# Patient Record
Sex: Female | Born: 1975 | Race: White | Marital: Married | Smoking: Current every day smoker
Health system: Southern US, Community
[De-identification: ages and names within clinical notes are randomized; demographics above are authoritative.]

---

## 2021-02-07 ENCOUNTER — Other Ambulatory Visit: Payer: Self-pay | Admitting: Physician Assistant

## 2021-02-07 DIAGNOSIS — R42 Dizziness and giddiness: Secondary | ICD-10-CM

## 2021-02-07 DIAGNOSIS — H53483 Generalized contraction of visual field, bilateral: Secondary | ICD-10-CM

## 2021-02-07 DIAGNOSIS — R519 Headache, unspecified: Secondary | ICD-10-CM

## 2021-02-13 ENCOUNTER — Ambulatory Visit
Admission: RE | Admit: 2021-02-13 | Discharge: 2021-02-13 | Disposition: A | Discharge status: Home / Self Care | Payer: Federal, State, Local not specified - PPO | Source: Ambulatory Visit | Attending: Physician Assistant | Admitting: Physician Assistant

## 2021-02-13 ENCOUNTER — Other Ambulatory Visit: Payer: Self-pay

## 2021-02-13 DIAGNOSIS — R42 Dizziness and giddiness: Secondary | ICD-10-CM | POA: Insufficient documentation

## 2021-02-13 DIAGNOSIS — R519 Headache, unspecified: Secondary | ICD-10-CM | POA: Diagnosis present

## 2021-02-13 DIAGNOSIS — H53483 Generalized contraction of visual field, bilateral: Secondary | ICD-10-CM | POA: Diagnosis not present

## 2021-02-13 IMAGING — MR MR HEAD W/O CM
12 series · 40 of 48 positions shown · IV contrast (agent unspecified)
Comparison: Head CT [DATE].

CLINICAL DATA: Tunnel visual field constriction of both eyes
[S2] ([S2]-CM). Headache disorder [S2] ([S2]-CM). Vertigo
R42 ([S2]-CM). Additional history provided by scanning
technologist: Patient reports vertigo, dizziness, vomiting,
headache, history of migraines, history of stroke in [S2].

EXAM:
MRI HEAD WITHOUT CONTRAST
MRV HEAD WITHOUT AND WITH CONTRAST
TECHNIQUE: Multiplanar, multiecho pulse sequences of the brain and surrounding
structures were obtained without intravenous contrast. Angiographic
images of the intracranial venous structures were obtained using MRV
technique without and with intravenous contrast.

[Series 5: ax dwi_tracew · axial · 3.0mm · 0.65mm/px · z∈[-92,+62]mm · 3 of 48 slices shown]
[im 1/48]
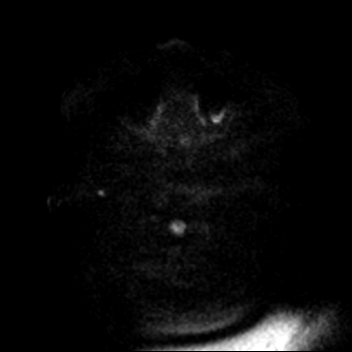
[im 24/48]
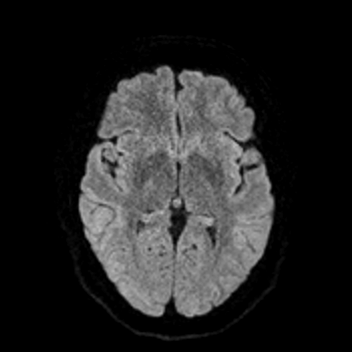
[im 48/48]
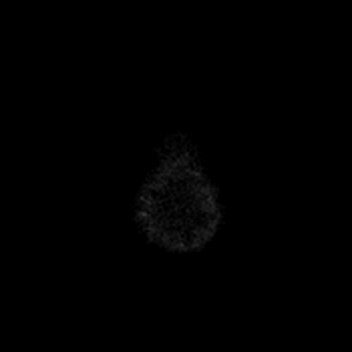

[Series 6: ax dwi_adc · axial · 3.0mm · 0.65mm/px · z∈[-92,+62]mm · 4 of 48 slices shown]
[im 1/48]
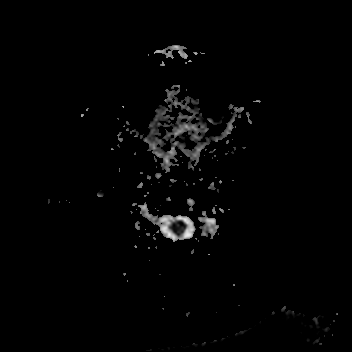
[im 16/48]
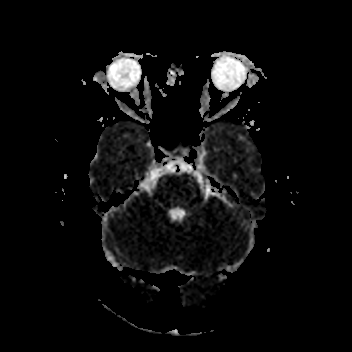
[im 32/48]
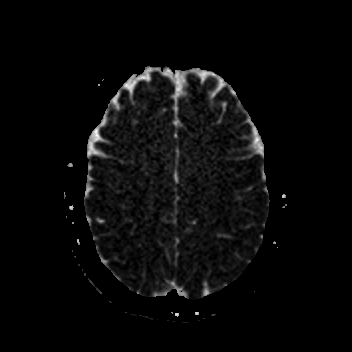
[im 48/48]
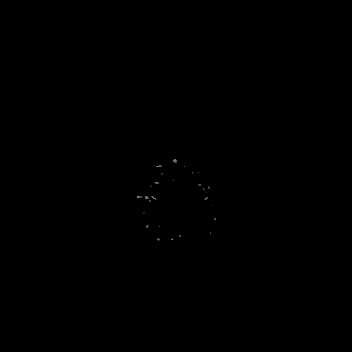

[Series 7: cor dwi_tracew · coronal · 5.0mm · 0.65mm/px · 3 of 38 slices shown]
[im 1/38]
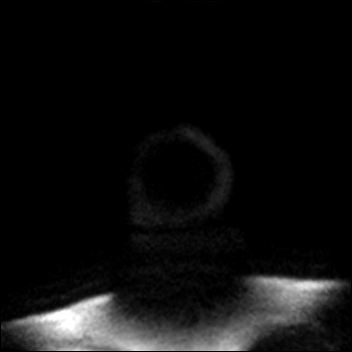
[im 19/38]
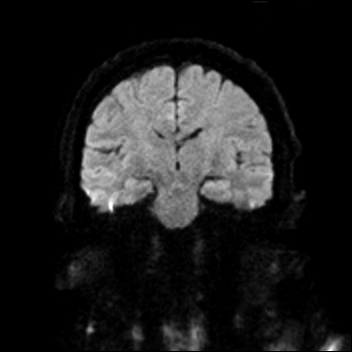
[im 38/38]
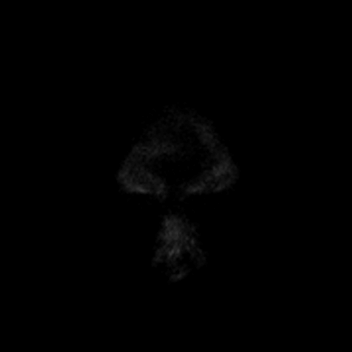

[Series 8: cor dwi_adc · coronal · 5.0mm · 0.65mm/px · 3 of 36 slices shown]
[im 1/36]
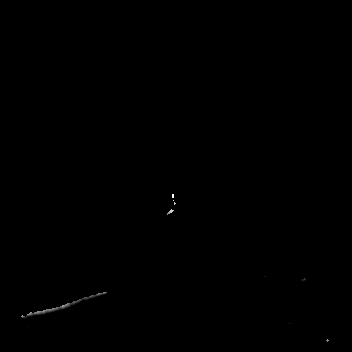
[im 18/36]
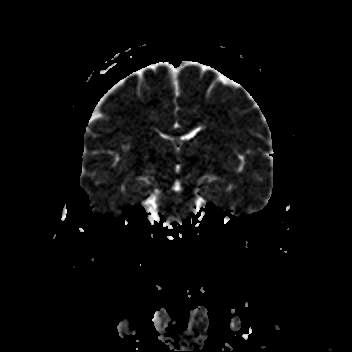
[im 36/36]
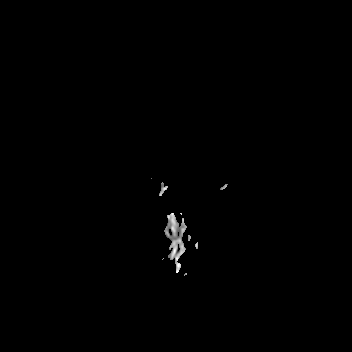

[Series 9: T1 · sagittal · 5.0mm · 0.62mm/px · 2 of 25 slices shown (1 of 3)]
[im 1/25]
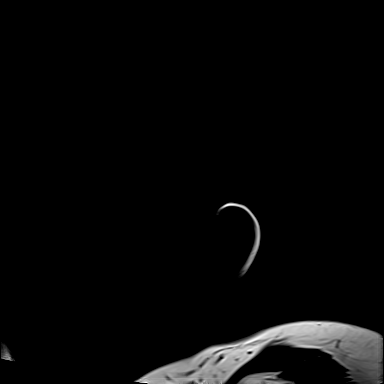
[im 25/25]
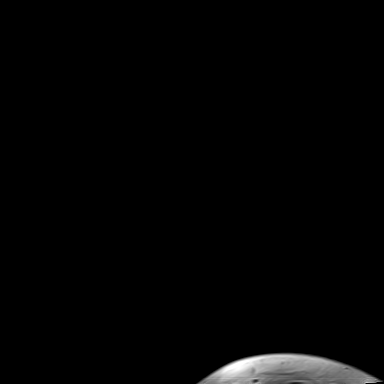

[Series 10: T2 · axial · 5.0mm · 0.53mm/px · z∈[-93,+62]mm · 2 of 27 slices shown (1 of 2)]
[im 1/27]
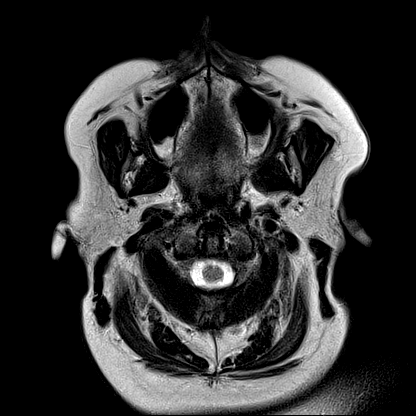
[im 27/27]
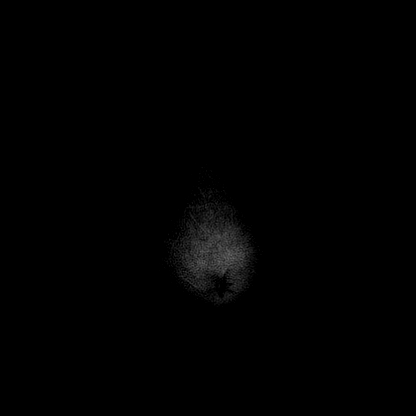

[Series 12: pha_images · axial · 3.0mm · 0.90mm/px · z∈[-104,+72]mm · 4 of 59 slices shown]
[im 1/59]
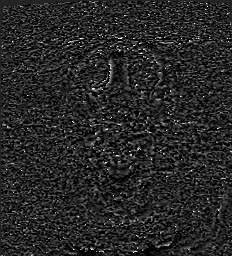
[im 20/59]
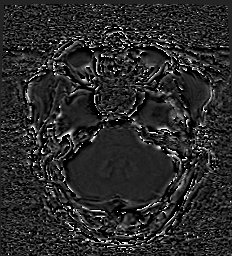
[im 39/59]
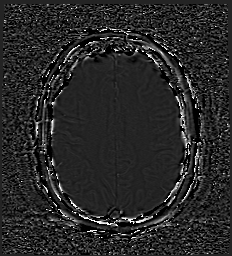
[im 59/59]
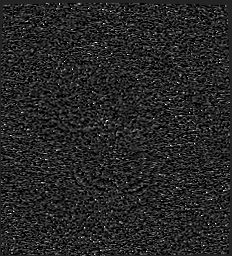

[Series 13: swi_images · axial · 3.0mm · 0.90mm/px · z∈[-104,-62]mm · 2 of 60 slices shown]
[im 1/60]
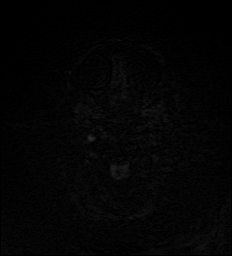
[im 15/60]
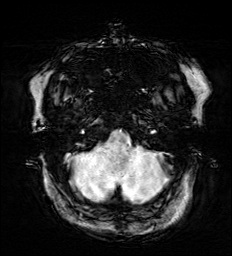

[Series 15: FLAIR · axial · 3.0mm · 0.53mm/px · z∈[-96,+65]mm · 4 of 55 slices shown]
[im 1/55]
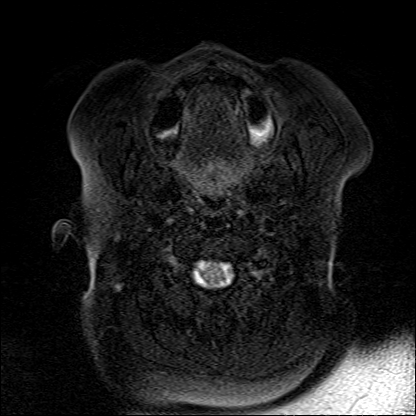
[im 19/55]
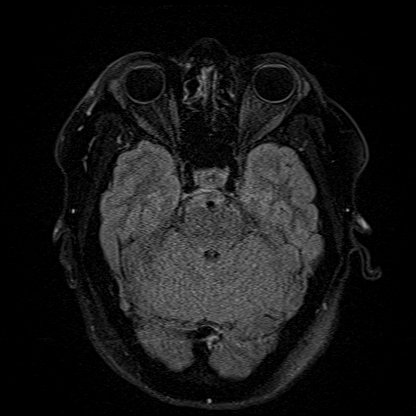
[im 37/55]
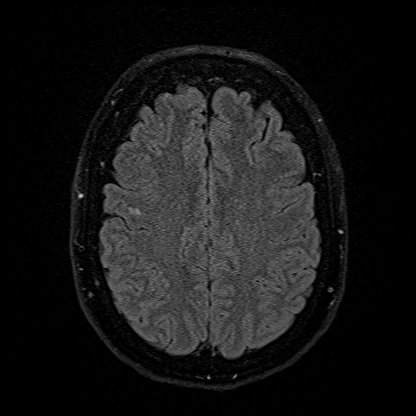
[im 55/55]
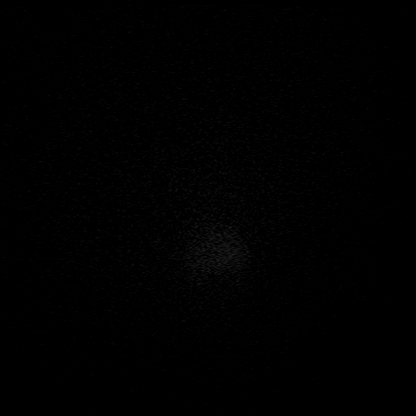

[Series 16: T1 · axial · 1.0mm · 0.98mm/px · z∈[-101,+73]mm · 8 of 176 slices shown (2 of 3)]
[im 1/176]
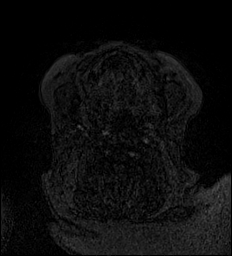
[im 30/176]
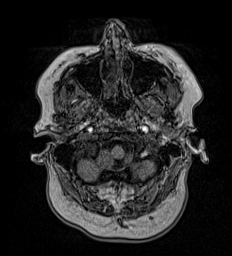
[im 59/176]
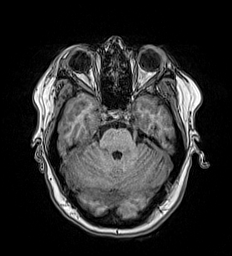
[im 73/176]
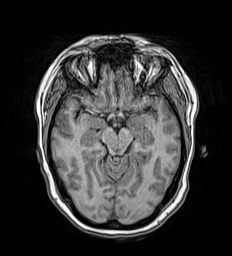
[im 103/176]
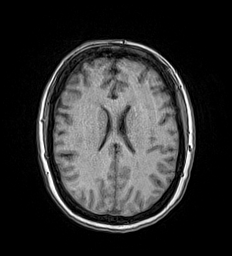
[im 117/176]
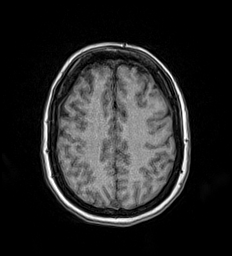
[im 146/176]
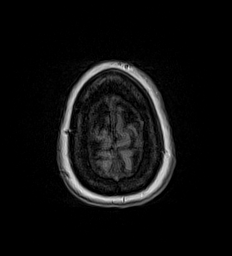
[im 176/176]
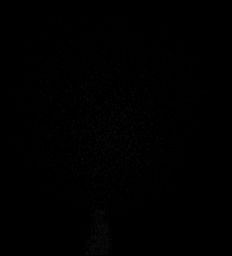

[Series 17: T2 · coronal · 5.0mm · 0.45mm/px · 3 of 33 slices shown (2 of 2)]
[im 1/33]
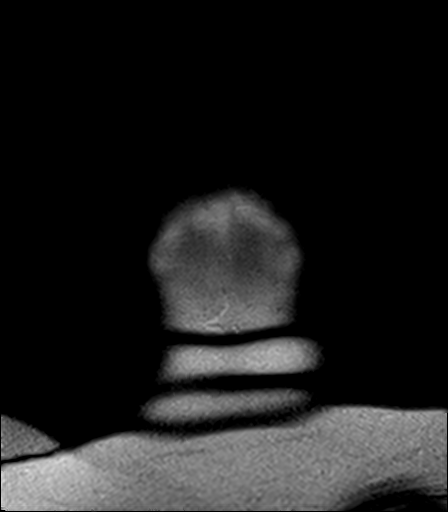
[im 17/33]
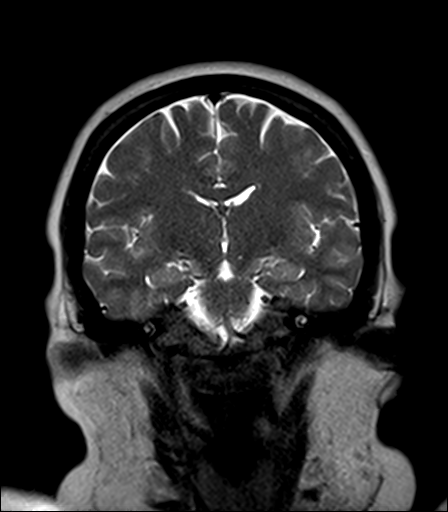
[im 33/33]
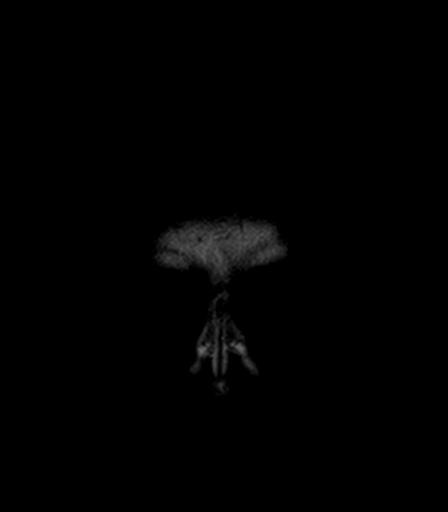

[Series 18: T1 · axial · 5.0mm · 0.90mm/px · z∈[-94,+61]mm · 2 of 27 slices shown (3 of 3)]
[im 1/27]
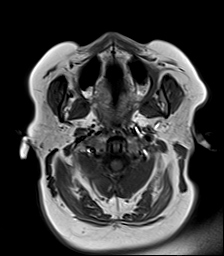
[im 27/27]
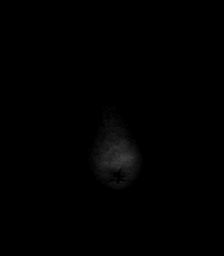

[40 of 48 positions shown; findings below may reference images not displayed]

Head CT and CT angiogram head/neck
[DATE]. Brain MRI [DATE].

CONTRAST:  9mL GADAVIST GADOBUTROL 1 MMOL/ML IV SOLN
FINDINGS: MR HEAD FINDINGS

Brain:

Mild intermittent motion degradation.

Cerebral volume is normal.

Mild multifocal T2/FLAIR hyperintensity within the cerebral white
matter, nonspecific and not significantly changed from the brain MRI
of [DATE].

There is no acute infarct.

No evidence of an intracranial mass.

No chronic intracranial blood products.

No extra-axial fluid collection.

No midline shift.

Redemonstrated partially empty sella turcica.

Vascular: Expected proximal arterial flow voids.

Skull and upper cervical spine: No focal marrow lesion.

Sinuses/Orbits: Trace bilateral ethmoid sinus mucosal thickening.

MRV HEAD FINDINGS

The superior sagittal sinus, internal cerebral veins, vein of KALBERMATTER,
straight sinus, transverse sinuses, sigmoid sinuses and visualized
jugular veins are patent. There is no appreciable intracranial
venous thrombosis. There are prominent arachnoid granulations within
the bilateral transverse/sigmoid dural venous sinuses, but no
convincing dural venous sinus stenosis.
IMPRESSION: MRI brain:

1. No evidence of acute intracranial abnormality.
2. Stable MRI appearance of the brain as compared to [DATE].
3. Redemonstrated partially empty sella turcica. This finding is
commonly incidental, but can be associated with idiopathic
intracranial hypertension.
4. Mild multifocal T2/FLAIR hyperintensity within the cerebral white
matter. Findings are nonspecific and differential considerations
include chronic small vessel ischemic disease, signal changes
associated with chronic migraine headaches, sequela of a prior
infectious/inflammatory process or sequela of a demyelinating
process such as multiple sclerosis, among others.

MRV head:

1. No evidence of intracranial venous thrombosis.
2. No convincing dural venous sinus stenosis.

## 2021-02-13 IMAGING — MR MR MRV HEAD WO/W CM
4 of 5 series · 30 of 48 positions shown · IV contrast (9ml Gadavist)
Comparison: Head CT [DATE].

CLINICAL DATA: Tunnel visual field constriction of both eyes
[S2] ([S2]-CM). Headache disorder [S2] ([S2]-CM). Vertigo
R42 ([S2]-CM). Additional history provided by scanning
technologist: Patient reports vertigo, dizziness, vomiting,
headache, history of migraines, history of stroke in [S2].

EXAM:
MRI HEAD WITHOUT CONTRAST
MRV HEAD WITHOUT AND WITH CONTRAST
TECHNIQUE: Multiplanar, multiecho pulse sequences of the brain and surrounding
structures were obtained without intravenous contrast. Angiographic
images of the intracranial venous structures were obtained using MRV
technique without and with intravenous contrast.

[Series 5: TOF · coronal · 2.5mm · 0.98mm/px · 9 of 127 slices shown]
[im 1/127]
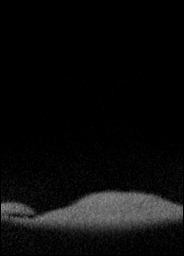
[im 16/127]
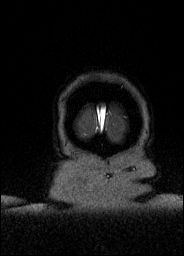
[im 32/127]
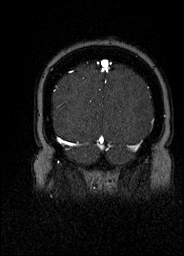
[im 48/127]
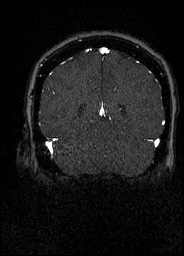
[im 64/127]
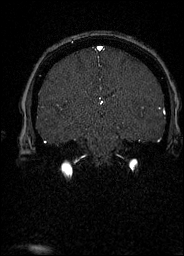
[im 79/127]
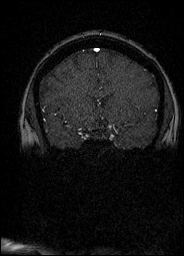
[im 95/127]
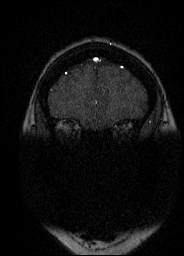
[im 111/127]
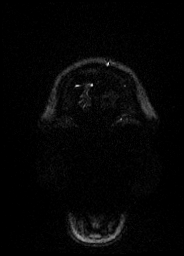
[im 127/127]
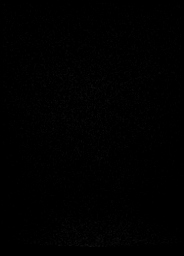

[Series 12: T1 post-contrast · sagittal · 1.0mm · 0.98mm/px · 8 of 192 slices shown]
[im 1/192]
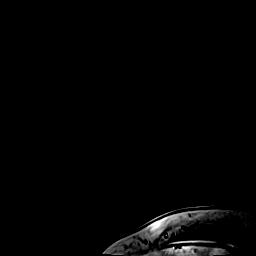
[im 30/192]
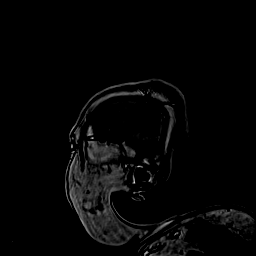
[im 59/192]
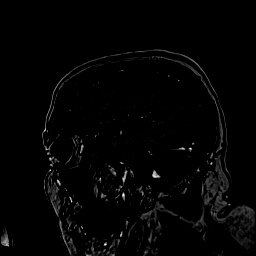
[im 89/192]
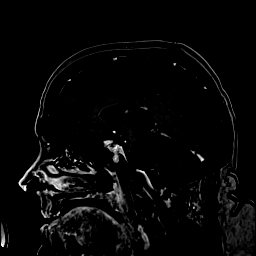
[im 103/192]
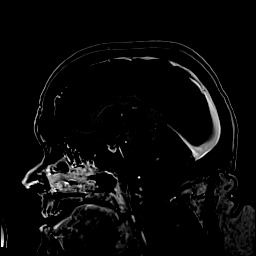
[im 133/192]
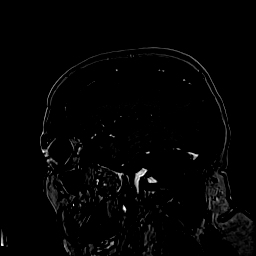
[im 162/192]
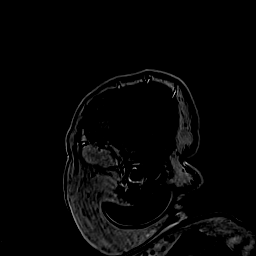
[im 192/192]
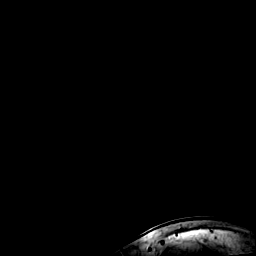

[Series 1069: mpr 1mm range · coronal · 1.0mm · 0.49mm/px · 9 of 175 slices shown]
[im 1/175]
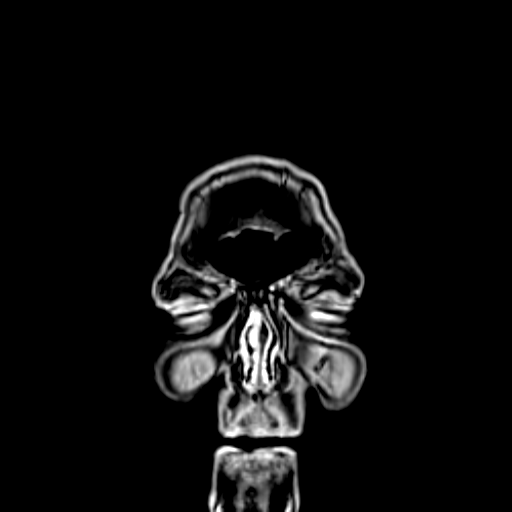
[im 32/175]
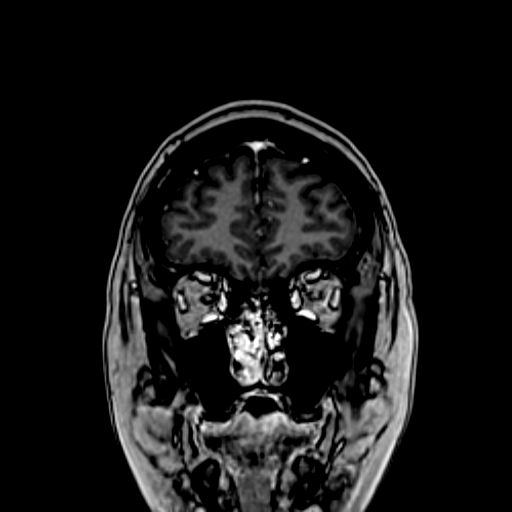
[im 48/175]
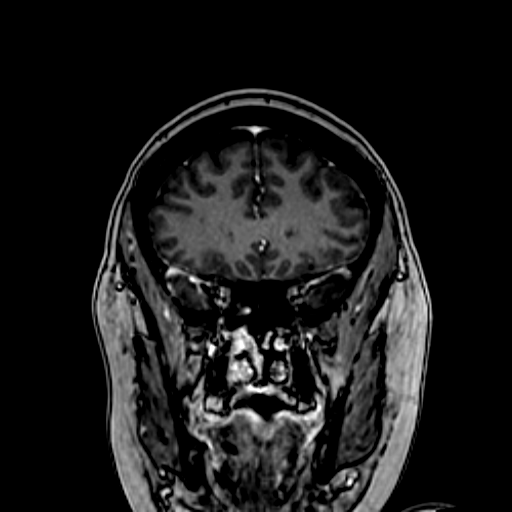
[im 80/175]
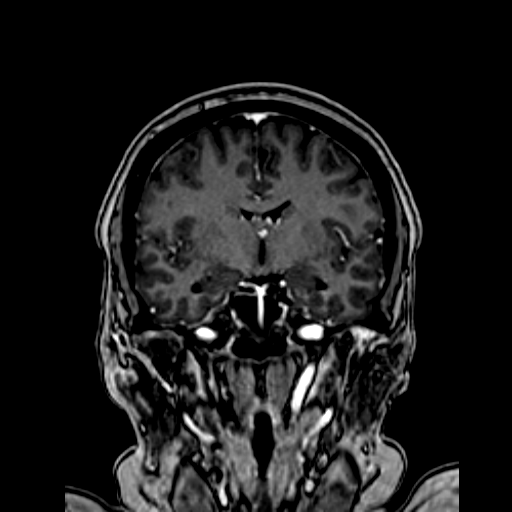
[im 95/175]
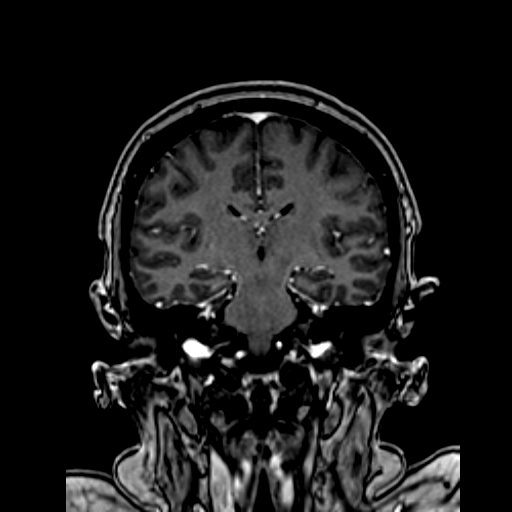
[im 127/175]
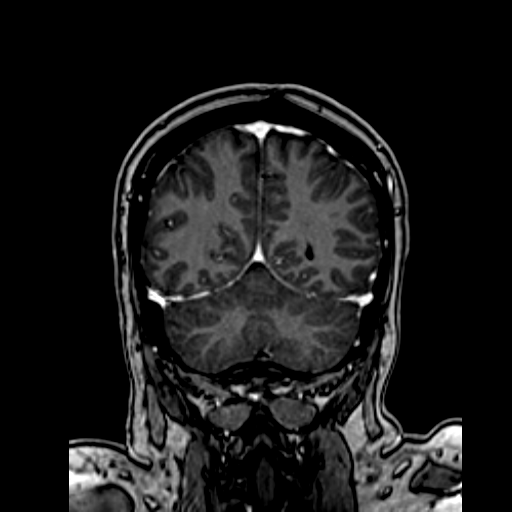
[im 143/175]
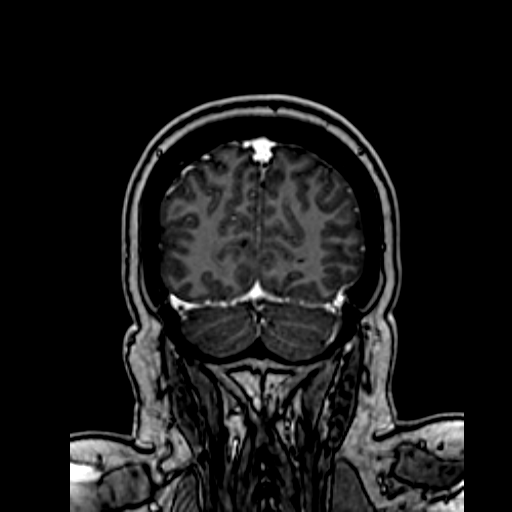
[im 159/175]
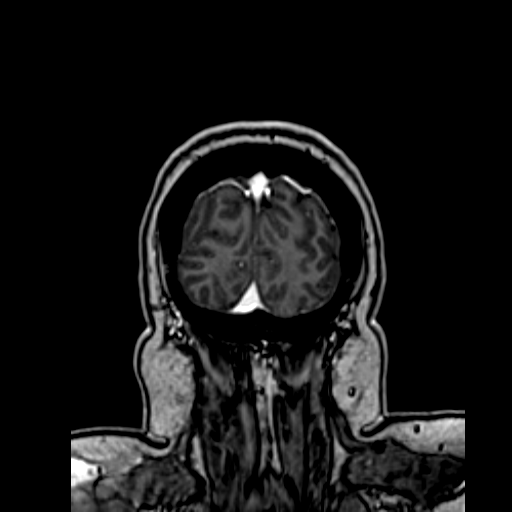
[im 175/175]
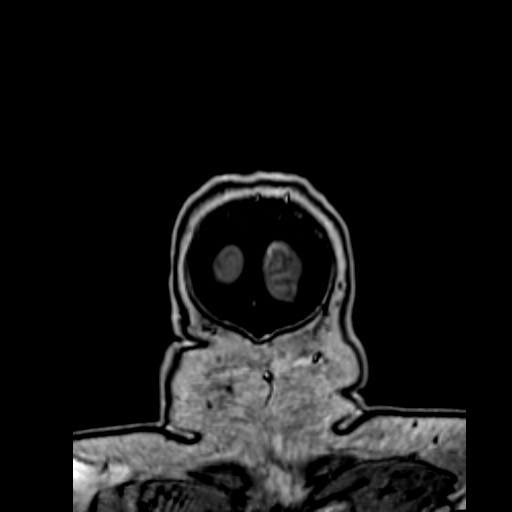

[Series 1075: mpr 1mm range(2) · axial · 1.0mm · 0.49mm/px · z∈[-120,+34]mm · 4 of 175 slices shown]
[im 1/175]
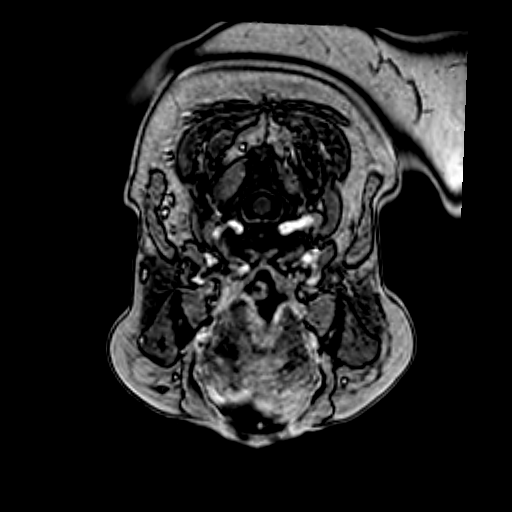
[im 32/175]
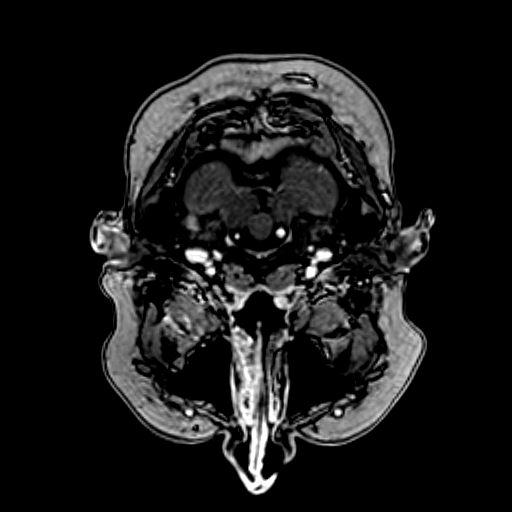
[im 95/175]
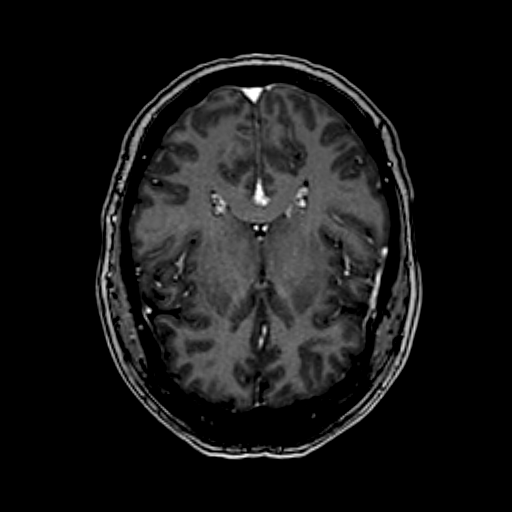
[im 159/175]
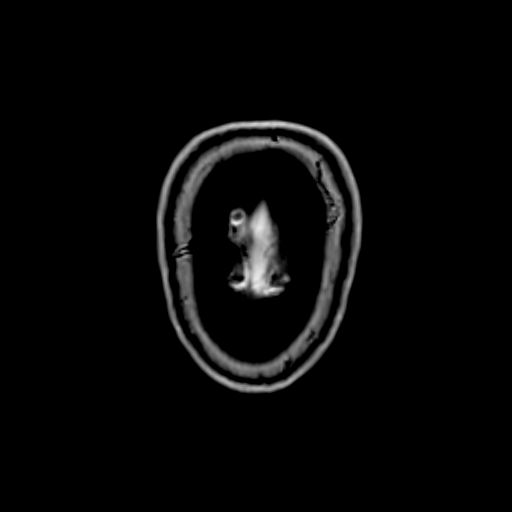

[30 of 48 positions shown; findings below may reference images not displayed]

Head CT and CT angiogram head/neck
[DATE]. Brain MRI [DATE].

CONTRAST:  9mL GADAVIST GADOBUTROL 1 MMOL/ML IV SOLN
FINDINGS: MR HEAD FINDINGS

Brain:

Mild intermittent motion degradation.

Cerebral volume is normal.

Mild multifocal T2/FLAIR hyperintensity within the cerebral white
matter, nonspecific and not significantly changed from the brain MRI
of [DATE].

There is no acute infarct.

No evidence of an intracranial mass.

No chronic intracranial blood products.

No extra-axial fluid collection.

No midline shift.

Redemonstrated partially empty sella turcica.

Vascular: Expected proximal arterial flow voids.

Skull and upper cervical spine: No focal marrow lesion.

Sinuses/Orbits: Trace bilateral ethmoid sinus mucosal thickening.

MRV HEAD FINDINGS

The superior sagittal sinus, internal cerebral veins, vein of KALBERMATTER,
straight sinus, transverse sinuses, sigmoid sinuses and visualized
jugular veins are patent. There is no appreciable intracranial
venous thrombosis. There are prominent arachnoid granulations within
the bilateral transverse/sigmoid dural venous sinuses, but no
convincing dural venous sinus stenosis.
IMPRESSION: MRI brain:

1. No evidence of acute intracranial abnormality.
2. Stable MRI appearance of the brain as compared to [DATE].
3. Redemonstrated partially empty sella turcica. This finding is
commonly incidental, but can be associated with idiopathic
intracranial hypertension.
4. Mild multifocal T2/FLAIR hyperintensity within the cerebral white
matter. Findings are nonspecific and differential considerations
include chronic small vessel ischemic disease, signal changes
associated with chronic migraine headaches, sequela of a prior
infectious/inflammatory process or sequela of a demyelinating
process such as multiple sclerosis, among others.

MRV head:

1. No evidence of intracranial venous thrombosis.
2. No convincing dural venous sinus stenosis.

## 2021-02-13 MED ORDER — GADOBUTROL 1 MMOL/ML IV SOLN: 9.0000 mL | Freq: Once | INTRAVENOUS | Status: DC | PRN

## 2021-02-13 MED ORDER — GADOBUTROL 1 MMOL/ML IV SOLN
9.0000 mL | Freq: Once | INTRAVENOUS | Status: AC | PRN
  Administered 2021-02-13: 9 mL via INTRAVENOUS

## 2021-03-05 ENCOUNTER — Other Ambulatory Visit: Payer: Self-pay | Admitting: Physician Assistant

## 2021-03-05 DIAGNOSIS — H53483 Generalized contraction of visual field, bilateral: Secondary | ICD-10-CM

## 2021-03-05 DIAGNOSIS — R200 Anesthesia of skin: Secondary | ICD-10-CM

## 2021-03-05 DIAGNOSIS — G932 Benign intracranial hypertension: Secondary | ICD-10-CM

## 2021-03-05 DIAGNOSIS — H43393 Other vitreous opacities, bilateral: Secondary | ICD-10-CM

## 2021-03-05 DIAGNOSIS — R519 Headache, unspecified: Secondary | ICD-10-CM

## 2021-03-05 DIAGNOSIS — R42 Dizziness and giddiness: Secondary | ICD-10-CM

## 2021-03-21 ENCOUNTER — Ambulatory Visit
Admission: RE | Admit: 2021-03-21 | Discharge: 2021-03-21 | Disposition: A | Discharge status: Home / Self Care | Payer: Federal, State, Local not specified - PPO | Source: Ambulatory Visit | Attending: Physician Assistant | Admitting: Physician Assistant

## 2021-03-21 ENCOUNTER — Other Ambulatory Visit: Payer: Self-pay

## 2021-03-21 DIAGNOSIS — R42 Dizziness and giddiness: Secondary | ICD-10-CM

## 2021-03-21 DIAGNOSIS — G932 Benign intracranial hypertension: Secondary | ICD-10-CM

## 2021-03-21 DIAGNOSIS — R200 Anesthesia of skin: Secondary | ICD-10-CM

## 2021-03-21 DIAGNOSIS — G35 Multiple sclerosis: Secondary | ICD-10-CM | POA: Insufficient documentation

## 2021-03-21 DIAGNOSIS — R519 Headache, unspecified: Secondary | ICD-10-CM | POA: Diagnosis present

## 2021-03-21 DIAGNOSIS — H53483 Generalized contraction of visual field, bilateral: Secondary | ICD-10-CM

## 2021-03-21 DIAGNOSIS — H43393 Other vitreous opacities, bilateral: Secondary | ICD-10-CM

## 2021-03-21 LAB — PATHOLOGIST SMEAR REVIEW

## 2021-03-21 LAB — ALBUMIN: Albumin: 3.8 g/dL (ref 3.5–5.0)

## 2021-03-21 LAB — CSF CELL COUNT WITH DIFFERENTIAL
Eosinophils, CSF: 0 %
Lymphs, CSF: 75 %
Monocyte-Macrophage-Spinal Fluid: 25 %
RBC Count, CSF: 10 /mm3 — ABNORMAL HIGH (ref 0–3)
Segmented Neutrophils-CSF: 0 %
Tube #: 3
WBC, CSF: 12 /mm3 (ref 0–5)

## 2021-03-21 LAB — GLUCOSE, CSF: Glucose, CSF: 65 mg/dL (ref 40–70)

## 2021-03-21 LAB — PROTEIN, CSF: Total  Protein, CSF: 33 mg/dL (ref 15–45)

## 2021-03-21 IMAGING — XA DG FLUORO GUIDE SPINAL/SI JT INJ*R*
1 series · 1 of 1 positions shown · non-contrast
Comparison: none

CLINICAL DATA: Headache for months, visual floaters, bilateral
numbness

[Series 2: vasc extremity · 1 of 1 slices shown]
[im 1/1]
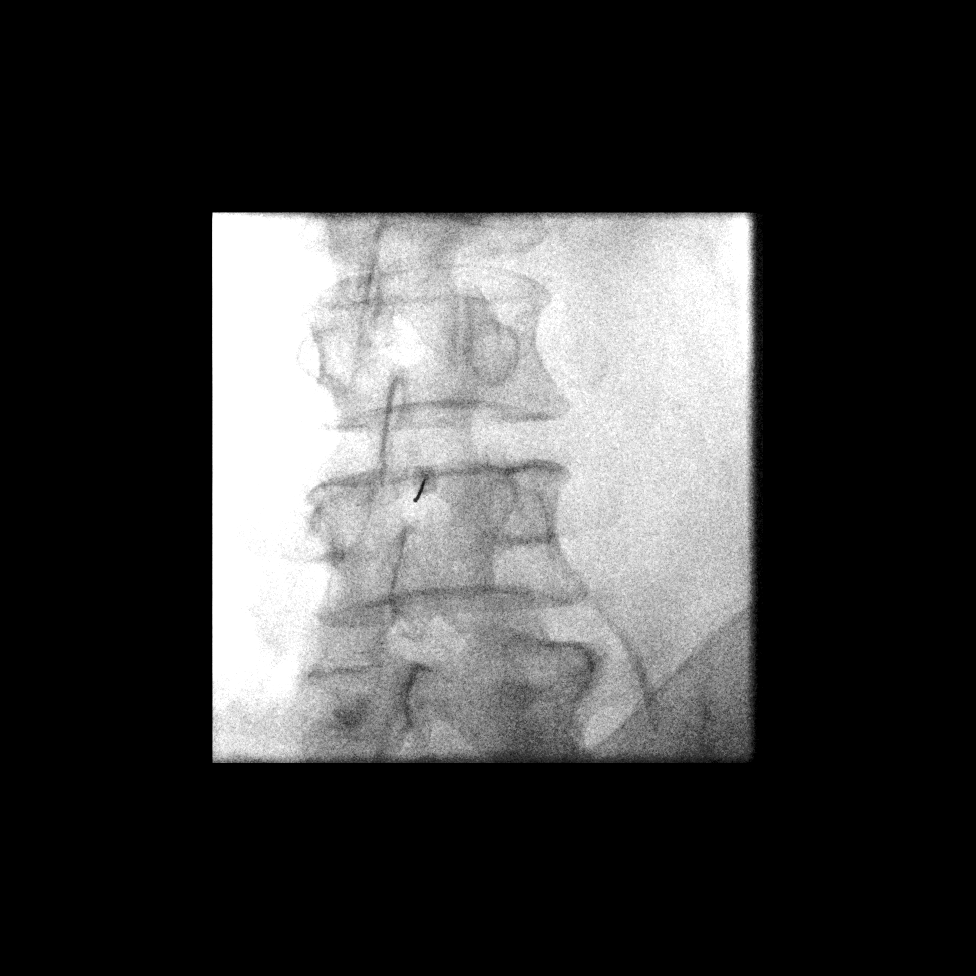

[1 of 1 positions shown; findings below may reference images not displayed]

EXAM:
DIAGNOSTIC LUMBAR PUNCTURE UNDER FLUOROSCOPIC GUIDANCE

FLUOROSCOPY TIME:  Fluoroscopy Time:  0.6 minute

Radiation Exposure Index (if provided by the fluoroscopic device):
5.6 mGy

PROCEDURE:
Informed consent was obtained from the patient prior to the
procedure, including potential complications of headache, allergy,
and pain. With the patient prone, the lower back was prepped with
Betadine. 1% Lidocaine was used for local anesthesia. Lumbar
puncture was performed at the initially L4-5, but patient had
radicular symptoms. Lumbar puncture was reperformed at L3-4 level
using a 22 gauge needle with return of clear CSF with an opening
pressure of 15 cm water. 8 ml of CSF were obtained for laboratory
studies. The patient tolerated the procedure well and there were no
apparent complications.
IMPRESSION: Successful fluoroscopic guided lumbar puncture as described above.

## 2021-03-21 MED ORDER — LIDOCAINE HCL (PF) 1 % IJ SOLN
5.0000 mL | Freq: Once | INTRAMUSCULAR | Status: AC
  Administered 2021-03-21: 5 mL
  Filled 2021-03-21: qty 5

## 2021-03-21 MED ORDER — LIDOCAINE HCL (PF) 1 % IJ SOLN
10.0000 mL | Freq: Once | INTRAMUSCULAR | Status: AC
  Administered 2021-03-21: 10 mL
  Filled 2021-03-21: qty 10

## 2021-03-21 MED ORDER — ACETAMINOPHEN 500 MG PO TABS
1000.0000 mg | ORAL_TABLET | Freq: Four times a day (QID) | ORAL | Status: DC | PRN
  Filled 2021-03-21: qty 2

## 2021-03-21 NOTE — Progress Notes (Signed)
Received phone call from laboratory with critical lab value for CSF WBCs.  Spoke directly with Dr. Allena Katz, Radiology and then called Dr. Malvin Johns, Neurology office [phone], and spoke with Louanna Raw, CMA with Dr. Malvin Johns, Neurologist.  Discussed critical lab value for CSF WBC and Toni Amend states she had seen the current results and then discussed with Toni Amend that patient is without headache, pain.  Toni Amend states she will discuss CSF results with Dr. Malvin Johns and patient may be discharged home secondary to no headache, pain and the Neurology Team will follow up with her once all CSF results are complete. [facility] phone number [phone]) with Toni Amend and asked she call back if, after discussion with Dr. Malvin Johns, plan for discharge home changes.

## 2021-03-24 LAB — CSF CULTURE W GRAM STAIN: Culture: NO GROWTH

## 2021-03-25 LAB — OLIGOCLONAL BANDS, CSF + SERM

## 2021-03-26 LAB — HSV(HERPES SMPLX VRS)ABS-I+II(IGG)-CSF: HSV Type I/II Ab, IgG CSF: 0.43 IV (ref <=0.89)

## 2021-08-12 DIAGNOSIS — I361 Nonrheumatic tricuspid (valve) insufficiency: Secondary | ICD-10-CM | POA: Diagnosis not present
